# Patient Record
Sex: Female | Born: 1935 | Race: White | Hispanic: No | Marital: Married | Smoking: Never smoker
Health system: Southern US, Community
[De-identification: ages and names within clinical notes are randomized; demographics above are authoritative.]

## PROBLEM LIST (undated history)

## (undated) DIAGNOSIS — G309 Alzheimer's disease, unspecified: Secondary | ICD-10-CM

## (undated) DIAGNOSIS — F0280 Dementia in other diseases classified elsewhere without behavioral disturbance: Secondary | ICD-10-CM

## (undated) HISTORY — PX: APPENDECTOMY: SHX54

---

## 1999-05-04 ENCOUNTER — Ambulatory Visit (HOSPITAL_COMMUNITY): Admission: RE | Admit: 1999-05-04 | Discharge: 1999-05-04 | Payer: Self-pay | Admitting: Neurology

## 2001-07-10 ENCOUNTER — Encounter: Payer: Self-pay | Admitting: Internal Medicine

## 2001-07-10 ENCOUNTER — Encounter: Admission: RE | Admit: 2001-07-10 | Discharge: 2001-07-10 | Payer: Self-pay | Admitting: Internal Medicine

## 2002-03-02 ENCOUNTER — Encounter: Admission: RE | Admit: 2002-03-02 | Discharge: 2002-05-31 | Payer: Self-pay | Admitting: Neurology

## 2002-03-16 ENCOUNTER — Ambulatory Visit: Admission: RE | Admit: 2002-03-16 | Discharge: 2002-03-16 | Payer: Self-pay | Admitting: *Deleted

## 2005-06-08 ENCOUNTER — Emergency Department (HOSPITAL_COMMUNITY): Admission: EM | Admit: 2005-06-08 | Discharge: 2005-06-09 | Payer: Self-pay | Admitting: Emergency Medicine

## 2005-11-13 ENCOUNTER — Inpatient Hospital Stay (HOSPITAL_COMMUNITY): Admission: EM | Admit: 2005-11-13 | Discharge: 2005-11-15 | Payer: Self-pay | Admitting: Emergency Medicine

## 2006-10-04 ENCOUNTER — Inpatient Hospital Stay (HOSPITAL_COMMUNITY): Admission: EM | Admit: 2006-10-04 | Discharge: 2006-10-08 | Payer: Self-pay | Admitting: Emergency Medicine

## 2007-02-18 ENCOUNTER — Encounter: Admission: RE | Admit: 2007-02-18 | Discharge: 2007-02-18 | Payer: Self-pay | Admitting: Neurology

## 2009-06-17 ENCOUNTER — Encounter: Payer: Self-pay | Admitting: Neurology

## 2010-11-24 NOTE — Op Note (Signed)
[name], [name]                   ACCOUNT NO.:  [id number]   MEDICAL RECORD NO.:  [id number]          PATIENT TYPE:  INP   LOCATION:  5705                         FACILITY:  [facility]   PHYSICIAN:  John C. Madilyn Fireman, M.D.    DATE OF BIRTH:  02/24/1926   DATE OF PROCEDURE:  11/15/2005  DATE OF DISCHARGE:  11/15/2005                                 OPERATIVE REPORT   PROCEDURE:  Colonoscopy.   ENDOSCOPIST:  Everardo All. Madilyn Fireman, M.D.   INDICATIONS FOR PROCEDURE:  Intermittent rectal bleeding in a 77 year old  patient with no prior colon screening.   PROCEDURE:  The patient was placed in the left lateral decubitus position  and placed on the pulse monitor with continuous low-flow oxygen delivered by  nasal cannula.  He was sedated with 35 mcg IV fentanyl and 2.5 mg IV Versed.  The Olympus video colonoscope was inserted into the rectum and advanced to  the cecum, confirmed by transillumination of McBurney's point and  visualization of the ileocecal valve and appendiceal orifice.  The prep was  excellent.  The cecum and ascending colon appeared normal with no masses,  polyps, diverticula or other mucosal abnormalities.  There were a few  diverticula seen in the transverse colon and more seen in the descending and  sigmoid colon with no other abnormalities noted.  The rectum appeared  normal.  A retroflexed view of the anus did reveal some small internal  hemorrhoids; there were fairly large external hemorrhoids as well.  The  scope was then withdrawn and the patient returned to the recovery room in  stable condition.  She tolerated the procedure well and there were no  immediate complications.   IMPRESSION:  1.  Diverticulosis.  2.  External hemorrhoids, probably responsible for recent bleeding.   PLAN:  Conservative management of hemorrhoids.           ______________________________  Everardo All Madilyn Fireman, M.D.     JCH/MEDQ  D:  11/15/2005  T:  11/16/2005  Job:  [id number]   cc:   Thora Lance,  M.D.  Fax: [phone]

## 2010-11-24 NOTE — H&P (Signed)
NAMERENIE, STELMACH                   ACCOUNT NO.:  [id number]   MEDICAL RECORD NO.:  [id number]          PATIENT TYPE:  EMS   LOCATION:  ED                           FACILITY:  [facility]   PHYSICIAN:  Barnetta Chapel, MDDATE OF BIRTH:  May 30, 1926   DATE OF ADMISSION:  10/04/2006  DATE OF DISCHARGE:                              HISTORY & PHYSICAL   PRIMARY CARE PHYSICIAN:  Thora Lance, M.D.   CHIEF COMPLAINT:  Nausea and vomiting.   HISTORY OF PRESENTING COMPLAINT:  The patient is a 77 year old female  with a past medical history significant for dementia, hypertension,  history of CVA, dyslipidemia, and basal cell carcinoma.  The patient  also has problems with bleeding hemorrhoids.  Most of the history came  from the patient's husband.  The patient was said to have had episodes  of nausea and vomiting about 2-3 hours prior to presentation.  No  associated abdominal pain.  No fevers or chills.  The patient had an  episode of loose bowels but no watery stools.  There are no sick  contacts.  Patient denied any chest pain or shortness of breath.   According to the patient's husband, the patient's color was said to have  changed during episode of nausea and vomiting, and this made him to call  the ambulance.  On presentation to the ER, the patient's blood sugar was  found to be elevated (282).  The patient was said to have eaten 1 or 2  hours prior to checking the blood sugar.  No history of polyuria,  polydipsia, or polyphagia volunteered.  No weight loss.   PAST MEDICAL HISTORY:  1. Dementia:  The type is uncertain.  2. Hypertension.  3. Peripheral edema.  4. CVA.  5. Depression.  6. Hypercholesterolemia.  7. Decreased urine.  8. Basal cell carcinoma.   PAST SURGICAL HISTORY:  1. Total abdominal hysterectomy.  2. Appendectomy.  3. Basal cell carcinoma excision.   MEDICATIONS PRIOR TO ADMISSION:  1. Aspirin.  2. Exelon.  3. HCTZ.  4. Lipitor.  5. Potassium  chloride.  6. Zoloft (exact dose of the meds are not known for now).   ALLERGIES:  PENICILLIN.   SOCIAL HISTORY:  The patient is married.  She lives with the husband.  She does not smoke cigarettes, does not drink alcohol, or use illicit  drugs.   FAMILY HISTORY:  Noncontributory.   REVIEW OF SYSTEMS:  Essentially as above.   PHYSICAL EXAMINATION:  VITAL SIGNS:  On admission, the temp is 97.3,  blood pressure 112/76, heart rate 91, respiratory rate 16.  GENERAL CONDITION:  The patient looks quite stable.  She is not in any  distress.  HEENT:  No pallor.  No jaundice.  The extraocular muscles are intact.  NECK:  Supple. There is no lymphadenopathy or elevated JVD.  LUNGS:  Clear to auscultation.  CVS:  S1 and S2.  No heart murmur appreciated.  ABDOMEN:  Obese.  Soft and nontender.  Bowel sounds are present.  Organs  are very difficult to assess.  NEURO:  Nonfocal.  The patient moves all extremities.  EXTREMITIES:  No edema.   Labs so far:  The EKG reveals a sinus rhythm with PACs.  The Q-T  interval is said to be prolonged.  I have not seen any old EKG to  compare this with.   White blood cell count is 10.8, hemoglobin 13.3, hematocrit 39.6,  platelets 220, MCV 89.2.  The sodium is 136, potassium 4.1, chloride 99,  CO2 29, BUN 49, creatinine 0.81.  The UA is essentially normal.   IMPRESSION:  1. Nausea and vomiting.  2. Hyperglycemia.  3. Rule out new-onset diabetes mellitus.  4. History of dementia.  5. History of hypertension.  6. History of dyslipidemia.   Will admit the patient to the regular medical floor under Dr. Kirby Funk.  We checked the blood sugar, q.a.c. and nightly but we will not  cover with insulin for now.  We will ask the nurses to call the MD if  the blood sugar is above 280 or less than 95.  We checked Hemoglobin A1C  as well as fasting blood sugar.  Will get the fasting lipid profile.  Will check the cardiac enzymes per protocol.  We will get  lipase and  amylase.  We will also check CMP, mag, and phos.  Further management  will depend on hospital course.      Barnetta Chapel, MD  Electronically Signed    SIO/MEDQ  D:  10/04/2006  T:  10/04/2006  Job:  [id number]   cc:   Thora Lance, M.D.  Fax: [phone]

## 2010-11-24 NOTE — Discharge Summary (Signed)
[name], [name]                   ACCOUNT NO.:  [id number]   MEDICAL RECORD NO.:  [id number]          PATIENT TYPE:  INP   LOCATION:  1618                         FACILITY:  [facility]   PHYSICIAN:  Thora Lance, M.D.  DATE OF BIRTH:  04-22-1926   DATE OF ADMISSION:  10/04/2006  DATE OF DISCHARGE:  10/08/2006                               DISCHARGE SUMMARY   A 77 year old white female who had episodes of nausea and vomiting about  2-3 hours prior to presentation.  No abdominal pain.  There were some  loose bowel movements and watery stools.  She was taken to the ER, where  the blood sugar was found to be 282.   SIGNIFICANT FINDINGS:  Temperature 97.3, blood pressure 112/76, heart  rate 91, respirations 16.  Her exam was unremarkable.  Abdominal exam  soft, nontender, with normal bowel sounds.   White blood cell count 10.8, hemoglobin 13.3, platelets 220.  Sodium  136, potassium 4.1, chloride 99, bicarbonate 29, BUN 41, creatinine 0.8.   HOSPITAL COURSE:  The patient was admitted for a possible  gastroenteritis.  She was treated with IV fluids.  Her nausea, vomiting  and diarrhea all resolved within 24 hours.  She was found to be  diabetic, new diagnosis.  A1c was 9.2.  She was started on metformin  b.i.d.  Her husband will check the blood sugar at home.  The last  hospital day she was up ambulating with minimal assistance for 18 feet  with a rolling walker, was discharged in good condition.   DISCHARGE DIAGNOSES:  1. Gastroenteritis.  2. Diabetes mellitus type 2.  3. Dementia.  4. Hypertension.  5. Peripheral edema.  6. History of cerebrovascular accident.  7. Depression.  8. Hyperlipidemia.   PROCEDURES:  None.   DISCHARGE MEDICATIONS:  1. Metformin 500 mg p.o. b.i.d.  2. Aspirin 81 mg a day.  3. Exelon __________ dose 6 mg a.m., 4 mg p.m.  4. Hydrochlorothiazide 12.5 mg once a day.  5. Lipitor 10 mg a day.  6. Potassium 10 mEq twice a day.  7. Felodipine 5 mg a  day.  8. Sertraline 50 mg one a day.   DISPOSITION:  Discharged to home with home health physical therapy.   DIET:  Low-sodium diabetic diet.   ACTIVITY:  Per physical therapy.   FOLLOW-UP:  2 weeks.           ______________________________  Thora Lance, M.D.     Delorse Limber  D:  10/08/2006  T:  10/08/2006  Job:  [id number]

## 2010-11-24 NOTE — Discharge Summary (Signed)
[name], [name]                   ACCOUNT NO.:  [id number]   MEDICAL RECORD NO.:  [id number]          PATIENT TYPE:  INP   LOCATION:  5705                         FACILITY:  [facility]   PHYSICIAN:  Thora Lance, M.D.  DATE OF BIRTH:  February 23, 1926   DATE OF ADMISSION:  11/13/2005  DATE OF DISCHARGE:  11/15/2005                                 DISCHARGE SUMMARY   REASON FOR ADMISSION:  A 77 year old female who presented with 2 episodes of  hematochezia the day of admission.  There was no abdominal pain.   PHYSICAL EXAMINATION:  VITALS:  Blood pressure is 133/67, heart rate 77,  supine blood pressure 140/51, heart rate 90 standing.  ABDOMEN:  Soft, normal bowel sounds, nontender.   LABORATORIES AT ADMISSION:  CBC:  WBC 11.2, hemoglobin 12.8, platelet count  248, PT 13.4, PTT 33, sodium 141, potassium 3.74, chloride 104, bicarbonate  29, glucose 143, BUN 10, creatinine __________, calcium 9.2, total protein  6,  AST 75, ALT 86, alk phos 85, total bilirubin 8.6, urinalysis normal.   HOSPITAL COURSE:  The patient was admitted for lower GI bleed.  Her  hemoglobin remains stable.  She was given a PPI.  Her aspirin  was held.  She was seen by GI on the second hospital day.  A colonoscopy was done on  May 10 and showed some left colon diverticulosis and external hemorrhoids.  Otherwise normal.  The patient was discharged home in good condition.   DISCHARGE DIAGNOSES:  1. Lower GI bleed.  2. Dementia.  3. Hypertension.  4. History of depression.   PROCEDURE:  Colonoscopy.   DISCHARGE MEDICATIONS:  1. Lipitor 10 mg daily.  2. Meclizine 12.5 mg, 2 tablets b.i.d.  3. Potassium chloride 10 mEq b.i.d.  4. Sertraline 50 mg daily.  5. Exelon 6 mg in AM, 4.5 mg PM.  6. Premarin 1.25 mg.  7. __________  5 mg a day.  8. Felodipine 5 mg a day.   DISPOSITION DISCHARGE HOME:  Activity as tolerated.  Diet:  Low sodium.  Follow up 2 weeks Dr. Valentina Lucks.           ______________________________  Thora Lance, M.D.     JJG/MEDQ  D:  02/21/2006  T:  02/21/2006  Job:  [id number]

## 2010-11-24 NOTE — H&P (Signed)
[name], [name]                   ACCOUNT NO.:  [id number]   MEDICAL RECORD NO.:  [id number]          PATIENT TYPE:  INP   LOCATION:  1826                         FACILITY:  [facility]   PHYSICIAN:  Marcene Duos, M.D.DATE OF BIRTH:  [birth year]   DATE OF ADMISSION:  11/13/2005  DATE OF DISCHARGE:                                HISTORY & PHYSICAL   HISTORY OF PRESENT ILLNESS:  [name] is a 77 year old female patient of Dr.  Kirby [name] with a history of dementia and hemorrhoids who presents with a  history of 2 stools today described consisting of mainly clotted dark blood  per her husband.  This apparently was described to him by a caretaker who  watches her during the day.  The last one was around 3 p.m.  They both deny  any abdominal pain, nausea, vomiting or dizziness.  Her last regular bowel  movement, he believes, was possibly last evening and was soft.  She has not  had any recent weight loss, melena or hematochezia.  When her hemorrhoids  have acted up in the past, it has been more of a pinkening to the toilet  water rather than what they saw today.  In the emergency room, she was not  found to be orthostatic.  Her coags were normal.  Her hemoglobin was 12.8  and her stool was heme negative, as performed by the ED physician, and she  has no obvious actively bleeding hemorrhoids.   PAST MEDICAL HISTORY:  1.  Dementia, uncertain what type.  2.  Hypertension.  3.  Peripheral edema.  4.  Remote history of stroke.  5.  Depression.  6.  Hypercholesterolemia.  7.  Decreased hearing.  8.  Basal cell carcinoma.   PAST SURGICAL HISTORY:  1.  TAH and BSO performed in different stages, all for benign reasons.  2.  Appendectomy.  3.  Basal cell carcinoma excision.   MEDICATIONS:  1.  Aspirin 81 mg daily.  2.  Lipitor 10 mg daily.  3.  Meclizine 12.5 mg two tablets b.i.d.  4.  KCl 10 mEq b.i.d.  5.  Sertraline 50 mg daily.  6.  Exelon 6 mg q.a.m., 4.5 mg q.p.m.  7.   Premarin 1.25 mg daily.  8.  Hydrochlorothiazide 25 mg daily.  9.  Felodipine 5 mg daily.  10. Multivitamins.   ALLERGIES:  PENICILLIN - rash.   SOCIAL HISTORY:  Tobacco and alcohol none.  History of Manufacturing engineer  and housewife.  Married for 57 years on January 20, 2006.   FAMILY HISTORY:  Father died at age 79 of prostate cancer.  Mother died in  her early 6s after being bedridden by a stroke.  Four siblings - a brother,  age 49, with psychiatric disease.  A sister died, she believes, of  pneumonia, and also had psychiatric disease of unknown type.  Brother died  of lung cancer; he was a smoker.  Sister died of metastatic breast cancer.  They have 3 living children, 1 was stillborn.  They have a son who is 18  with  emphysema and a smoker.   REVIEW OF SYSTEMS:  No history of colonoscopy.  No family history of colon  cancer.  No history of diverticulosis.   PHYSICAL EXAMINATION:  VITAL SIGNS:  Temperature 98.2, respirations 18, with  sort of glottic breathing and maybe some mild abdominal breathing, but she  is lying somewhat awkwardly on the cot, 99% on room air, but currently on 2  liters of oxygen.  Blood pressure lying 133/67 with a pulse of 77, blood  pressure standing 140/61 with a pulse of 90.  GENERAL:  The patient is alert, laughing, in no acute distress.  HEENT:  Pupils equal, round and reactive to light.  Extraocular movements  intact.  Throat without injection.  NECK:  Supple without adenopathy.  No thyromegaly.  CHEST:  Clear.  Again, the glottic breathing is sort of abdominal muscle  use, as well.  ABDOMEN:  Obese.  Protuberant and soft.  Bowel sounds are present  throughout.  Nontender.  No hepatosplenomegaly or mass appreciated.  CARDIOVASCULAR:  Regular rate and rhythm without murmur or rub.  No carotid  bruits.  Carotid, radial, femoral, PT pulses normodynamic and equal.  EXTREMITIES:  Trace edema.   ASSESSMENT AND PLAN:  1.  Probable lower  gastrointestinal bleed, though her hemoglobin is actually      quite good at this point.  She did have abdominal x-rays.  I have not      had a chance to review.  Will check those, as well.  Will place her on a      PPI, despite this most likely being a lower GI bleed.  Attempting to      consult GI to see if they will evaluate her in the morning.  If her      hemoglobin, however, is stable, she may be able to go home and follow up      with GI as an outpatient.  Will hold her aspirin for now.  NPO after      midnight.  Clear liquids until then.  IV at [facility].  2.  Hypertension.  Continue felodipine only.  For now, will hold      hydrochlorothiazide.  3.  Dementia.  Will continue her Exelon.  4.  Depression.  Continue her sertraline, as well.  5.  Elevated liver enzymes.  The patient is on Lipitor and we are holding      that overnight.  Will recheck liver profile in the morning, but suspect      this will have to be followed up as an outpatient.           ______________________________  Marcene Duos, M.D.     EMM/MEDQ  D:  11/13/2005  T:  11/13/2005  Job:  [id number]

## 2011-06-26 ENCOUNTER — Other Ambulatory Visit (INDEPENDENT_AMBULATORY_CARE_PROVIDER_SITE_OTHER): Payer: Self-pay | Admitting: Surgery

## 2012-05-13 ENCOUNTER — Other Ambulatory Visit (INDEPENDENT_AMBULATORY_CARE_PROVIDER_SITE_OTHER): Payer: Self-pay | Admitting: Surgery

## 2013-12-28 ENCOUNTER — Emergency Department (HOSPITAL_COMMUNITY): Payer: Medicare Other

## 2013-12-28 ENCOUNTER — Emergency Department (HOSPITAL_COMMUNITY)
Admission: EM | Admit: 2013-12-28 | Discharge: 2013-12-28 | Disposition: A | Discharge status: Home / Self Care | Payer: Medicare Other | Attending: Emergency Medicine | Admitting: Emergency Medicine

## 2013-12-28 ENCOUNTER — Encounter (HOSPITAL_COMMUNITY): Payer: Self-pay | Admitting: Emergency Medicine

## 2013-12-28 DIAGNOSIS — N390 Urinary tract infection, site not specified: Secondary | ICD-10-CM | POA: Insufficient documentation

## 2013-12-28 DIAGNOSIS — R55 Syncope and collapse: Secondary | ICD-10-CM | POA: Insufficient documentation

## 2013-12-28 DIAGNOSIS — R079 Chest pain, unspecified: Secondary | ICD-10-CM | POA: Insufficient documentation

## 2013-12-28 DIAGNOSIS — R5382 Chronic fatigue, unspecified: Secondary | ICD-10-CM | POA: Insufficient documentation

## 2013-12-28 DIAGNOSIS — R404 Transient alteration of awareness: Secondary | ICD-10-CM | POA: Insufficient documentation

## 2013-12-28 DIAGNOSIS — F0281 Dementia in other diseases classified elsewhere with behavioral disturbance: Secondary | ICD-10-CM | POA: Insufficient documentation

## 2013-12-28 DIAGNOSIS — Z880 Allergy status to penicillin: Secondary | ICD-10-CM | POA: Insufficient documentation

## 2013-12-28 DIAGNOSIS — F02818 Dementia in other diseases classified elsewhere, unspecified severity, with other behavioral disturbance: Secondary | ICD-10-CM | POA: Insufficient documentation

## 2013-12-28 DIAGNOSIS — G309 Alzheimer's disease, unspecified: Secondary | ICD-10-CM | POA: Insufficient documentation

## 2013-12-28 DIAGNOSIS — G9332 Myalgic encephalomyelitis/chronic fatigue syndrome: Secondary | ICD-10-CM | POA: Insufficient documentation

## 2013-12-28 DIAGNOSIS — F0280 Dementia in other diseases classified elsewhere without behavioral disturbance: Secondary | ICD-10-CM | POA: Insufficient documentation

## 2013-12-28 HISTORY — DX: Dementia in other diseases classified elsewhere, unspecified severity, without behavioral disturbance, psychotic disturbance, mood disturbance, and anxiety: F02.80

## 2013-12-28 HISTORY — DX: Alzheimer's disease, unspecified: G30.9

## 2013-12-28 LAB — URINALYSIS, ROUTINE W REFLEX MICROSCOPIC
Bilirubin Urine: NEGATIVE
Glucose, UA: NEGATIVE mg/dL
Ketones, ur: NEGATIVE mg/dL
Nitrite: NEGATIVE
Protein, ur: NEGATIVE mg/dL
Specific Gravity, Urine: 1.024 (ref 1.005–1.030)
Urobilinogen, UA: 0.2 mg/dL (ref 0.0–1.0)
pH: 6.5 (ref 5.0–8.0)

## 2013-12-28 LAB — COMPREHENSIVE METABOLIC PANEL
ALT: 20 U/L (ref 0–35)
AST: 21 U/L (ref 0–37)
Albumin: 3.7 g/dL (ref 3.5–5.2)
Alkaline Phosphatase: 85 U/L (ref 39–117)
BUN: 14 mg/dL (ref 6–23)
CO2: 30 mEq/L (ref 19–32)
Calcium: 9.9 mg/dL (ref 8.4–10.5)
Chloride: 104 mEq/L (ref 96–112)
Creatinine, Ser: 0.57 mg/dL (ref 0.50–1.10)
GFR calc Af Amer: >90 mL/min (ref >90)
GFR calc non Af Amer: 82 mL/min — ABNORMAL LOW (ref >90)
Glucose, Bld: 158 mg/dL — ABNORMAL HIGH (ref 70–99)
Potassium: 4.8 mEq/L (ref 3.7–5.3)
Sodium: 144 mEq/L (ref 137–147)
Total Bilirubin: 0.3 mg/dL (ref 0.3–1.2)
Total Protein: 7.1 g/dL (ref 6.0–8.3)

## 2013-12-28 LAB — CBG MONITORING, ED: Glucose-Capillary: 111 mg/dL — ABNORMAL HIGH (ref 70–99)

## 2013-12-28 LAB — CBC
HEMATOCRIT: 42.2 % (ref 36.0–46.0)
Hemoglobin: 13.3 g/dL (ref 12.0–15.0)
MCH: 29.3 pg (ref 26.0–34.0)
MCHC: 31.5 g/dL (ref 30.0–36.0)
MCV: 93 fL (ref 78.0–100.0)
Platelets: 242 10*3/uL (ref 150–400)
RBC: 4.54 MIL/uL (ref 3.87–5.11)
RDW: 13.3 % (ref 11.5–15.5)
WBC: 10.5 10*3/uL (ref 4.0–10.5)

## 2013-12-28 LAB — I-STAT TROPONIN, ED: TROPONIN I, POC: 0 ng/mL (ref 0.00–0.08)

## 2013-12-28 LAB — URINE MICROSCOPIC-ADD ON

## 2013-12-28 MED ORDER — CEPHALEXIN 500 MG PO CAPS: 500.0000 mg | ORAL_CAPSULE | Freq: Four times a day (QID) | ORAL | Status: AC

## 2013-12-28 MED ORDER — CEPHALEXIN 250 MG PO CAPS
500.0000 mg | ORAL_CAPSULE | Freq: Once | ORAL | Status: AC
  Administered 2013-12-28: 500 mg via ORAL
  Filled 2013-12-28: qty 2

## 2013-12-28 NOTE — ED Provider Notes (Signed)
CSN: [id number]     Arrival date & time 12/28/13  1338 History   First MD Initiated Contact with Patient 12/28/13 1414     Chief Complaint  Patient presents with  . Altered Mental Status  . Fatigue  . Loss of Consciousness     (Consider location/radiation/quality/duration/timing/severity/associated sxs/prior Treatment) HPI Comments: Patient is an 80 year old female with a past medical history of alzheimer's dementia who presents after a syncopal episode that occurred prior to arrival. Patient is brought to the ED by her caretaker who also provides the history. Her caretaker reports she was giving the patient a bath this morning and the patient had a syncopal episode. She was unconscious for a brief period of time. No head trauma. Patient has "been sleeping all day and not acting herself." No other symptoms. No aggravating/alleviating factors.   Patient is a 80 y.o. female presenting with altered mental status and syncope.  Altered Mental Status Associated symptoms: no abdominal pain, no fever, no nausea, no palpitations, no vomiting and no weakness   Loss of Consciousness Associated symptoms: no chest pain, no dizziness, no fever, no nausea, no palpitations, no shortness of breath, no vomiting and no weakness     Past Medical History  Diagnosis Date  . Alzheimer's dementia    History reviewed. No pertinent past surgical history. History reviewed. No pertinent family history. History  Substance Use Topics  . Smoking status: Never Smoker   . Smokeless tobacco: Not on file  . Alcohol Use: No   OB History   Grav Para Term Preterm Abortions TAB SAB Ect Mult Living                 Review of Systems  Constitutional: Negative for fever, chills and fatigue.  HENT: Negative for trouble swallowing.   Eyes: Negative for visual disturbance.  Respiratory: Negative for shortness of breath.   Cardiovascular: Positive for syncope. Negative for chest pain and palpitations.   Gastrointestinal: Negative for nausea, vomiting, abdominal pain and diarrhea.  Genitourinary: Negative for dysuria and difficulty urinating.  Musculoskeletal: Negative for arthralgias and neck pain.  Skin: Negative for color change.  Neurological: Positive for syncope. Negative for dizziness and weakness.  Psychiatric/Behavioral: Negative for dysphoric mood.      Allergies  Penicillins  Home Medications   Prior to Admission medications   Not on File   BP 113/95  Pulse 83  Temp(Src) 97.8 F (36.6 C) (Oral)  Resp 24  SpO2 96% Physical Exam  Nursing note and vitals reviewed. Constitutional: She appears well-developed and well-nourished. No distress.  HENT:  Head: Normocephalic and atraumatic.  Mouth/Throat: Oropharynx is clear and moist. No oropharyngeal exudate.  Eyes: Conjunctivae and EOM are normal. Pupils are equal, round, and reactive to light.  Neck: Normal range of motion.  Cardiovascular: Normal rate and regular rhythm.  Exam reveals no gallop and no friction rub.   No murmur heard. Pulmonary/Chest: Effort normal and breath sounds normal. She has no wheezes. She has no rales. She exhibits no tenderness.  Abdominal: Soft. She exhibits no distension. There is no tenderness. There is no rebound and no guarding.  Musculoskeletal: Normal range of motion.  Neurological: She is alert. No cranial nerve deficit. Coordination normal.  Extremity strength and sensation equal and intact bilaterally. Patient oriented to persona and place. Speech is goal-oriented. Moves limbs without ataxia.   Skin: Skin is warm and dry.  Psychiatric: She has a normal mood and affect. Her behavior is normal.  ED Course  Procedures (including critical care time) Labs Review Labs Reviewed  COMPREHENSIVE METABOLIC PANEL - Abnormal; Notable for the following:    Glucose, Bld 158 (*)    GFR calc non Af Amer 82 (*)    All other components within normal limits  CBG MONITORING, ED - Abnormal;  Notable for the following:    Glucose-Capillary 111 (*)    All other components within normal limits  CBC  URINALYSIS, ROUTINE W REFLEX MICROSCOPIC  I-STAT TROPOININ, ED    Imaging Review Dg Chest 2 View  12/28/2013   CLINICAL DATA:  Syncope.  EXAM: CHEST  2 VIEW  COMPARISON:  None.  FINDINGS: Hyperinflation without edema or focal airspace consolidation. The cardiopericardial silhouette is within normal limits for size. Focal opacity noted at the right cardiophrenic border. Imaged bony structures of the thorax are intact.  IMPRESSION: Focal density at the right cardiophrenic border. Lung lesion is not excluded. CT chest without contrast recommended to further evaluate.  Hyperexpansion without edema.  These results will be called to the ordering clinician or representative by the Radiologist Assistant, and communication documented in the PACS or zVision Dashboard.   Electronically Signed   By: Kennith CenterEric  Mansell M.D.   On: 12/28/2013 15:20   Ct Head Wo Contrast  12/28/2013   CLINICAL DATA:  Altered mental status.  EXAM: CT HEAD WITHOUT CONTRAST  TECHNIQUE: Contiguous axial images were obtained from the base of the skull through the vertex without intravenous contrast.  COMPARISON:  MRI brain 06/17/2009.  FINDINGS: No mass. No hydrocephalus. No hemorrhage. White matter changes noted consistent with chronic ischemia. Old lacunar infarcts. No acute bony abnormality.  IMPRESSION: Diffuse cerebral and cerebellar atrophy. Chronic white matter ischemic change and old lacunar infarcts. Similar findings noted on prior MRI brain 06/17/2009.   Electronically Signed   By: Maisie Fushomas  Register   On: 12/28/2013 16:44   Ct Chest Wo Contrast  12/28/2013   CLINICAL DATA:  Fatigue. Possible pulmonary nodule noted on recent chest radiograph.  EXAM: CT CHEST WITHOUT CONTRAST  TECHNIQUE: Multidetector CT imaging of the chest was performed following the standard protocol without IV contrast.  COMPARISON:  No priors.  FINDINGS:  Mediastinum: Heart size is normal. There is no significant pericardial fluid, thickening or pericardial calcification. There is atherosclerosis of the thoracic aorta, the great vessels of the mediastinum and the coronary arteries, including calcified atherosclerotic plaque in the left anterior descending coronary arteries. Calcifications of the mitral annulus. No pathologically enlarged mediastinal or hilar lymph nodes. Please note that accurate exclusion of hilar adenopathy is limited on noncontrast CT scans. Esophagus is unremarkable in appearance. Prominent epicardial fat pad adjacent to the right atrium likely accounts for the perceived nodular opacity on the recent chest radiograph.  Lungs/Pleura: 6 mm right lower lobe nodule (image 34 of series 202). Other smaller nodules are seen in the lungs bilaterally, including a 5 mm subpleural nodule in the right lower lobe (image 45 of series 202, and a 2 mm nodule in in the left lower lobe (image 24 of series 202). No larger more suspicious appearing pulmonary nodules or masses are noted. No acute consolidative airspace disease. No pleural effusions.  Upper Abdomen: 2 cm low-attenuation lesion in the posterior aspect of the upper pole of the left kidney is incompletely characterized, but statistically likely a cyst. Nonobstructive calculi in the left renal collecting system, largest of which measures 11 mm in the interpolar region. Atherosclerosis.  Musculoskeletal: There are no aggressive appearing lytic or  blastic lesions noted in the visualized portions of the skeleton.  IMPRESSION: 1. The large nodular opacity noted on the recent chest radiograph corresponds to a prominent epicardial fat pad. This is a benign finding. 2. However, there are multiple small pulmonary nodules scattered throughout the lungs bilaterally which are nonspecific, largest of which measures 6 mm in the right lower lobe. If the patient is at high risk for bronchogenic carcinoma, follow-up  chest CT at 6-12 months is recommended. If the patient is at low risk for bronchogenic carcinoma, follow-up chest CT at 12 months is recommended. This recommendation follows the consensus statement: Guidelines for Management of Small Pulmonary Nodules Detected on CT Scans: A Statement from the Fleischner Society as published in Radiology 2005;237:395-400. 3. 11 mm nonobstructive calculus in the interpolar collecting system of the left kidney. 4. Atherosclerosis, including left anterior descending coronary artery disease.   Electronically Signed   By: Trudie Reedaniel  Entrikin M.D.   On: 12/28/2013 16:56     EKG Interpretation None      MDM   Final diagnoses:  UTI (lower urinary tract infection)  Chronic fatigue    2:24 PM Labs, urinalysis and chest xray pending. Vitals stable and patient afebrile. CT head pending.   Patient's labs unremarkable for acute changes. Patient signed out to Dr. Clayborne DanaMesner pending CT head, chest and urinalysis. Vitals stable and patient afebrile. If remaining findings are unremarkable, patient will be discharged home.     Emilia BeckKaitlyn Szekalski, [place] JerseyPA-C 12/29/13 [phone]

## 2013-12-28 NOTE — ED Notes (Signed)
Pt out of the department at this time

## 2013-12-28 NOTE — ED Provider Notes (Signed)
9:35 PM Assumed care from [facility]zek[facility], [place], please see their note for full history, physical and decision making until this point. In brief this is a 80 y.o. year old female who presented to the ED tonight with Altered Mental Status, Fatigue and Loss of Consciousness     Appears well. Awaiting head CT read and chest ct read (nodule seen on CXR) and urine. If all is well then can be d/c.  Imaging negative, labs well. Patient at baseline. Likely UTI. Culture already sent, will treat and fu w/ pcp.   Discharge instructions, including strict return precautions for new or worsening symptoms, given. Patient and/or family verbalized understanding and agreement with the plan as described.   Labs, studies and imaging reviewed by myself and considered in medical decision making if ordered. Imaging interpreted by radiology. Pt was discussed with my attending, Dr. Jeraldine LootsLockwood.  Labs Reviewed  COMPREHENSIVE METABOLIC PANEL - Abnormal; Notable for the following:    Glucose, Bld 158 (*)    GFR calc non Af Amer 82 (*)    All other components within normal limits  URINALYSIS, ROUTINE W REFLEX MICROSCOPIC - Abnormal; Notable for the following:    APPearance CLOUDY (*)    Hgb urine dipstick MODERATE (*)    Leukocytes, UA LARGE (*)    All other components within normal limits  URINE MICROSCOPIC-ADD ON - Abnormal; Notable for the following:    Bacteria, UA FEW (*)    All other components within normal limits  CBG MONITORING, ED - Abnormal; Notable for the following:    Glucose-Capillary 111 (*)    All other components within normal limits  URINE CULTURE  CBC  I-STAT TROPOININ, ED     Discharge Medication List as of 12/28/2013  5:05 PM    START taking these medications   Details  cephALEXin (KEFLEX) 500 MG capsule Take 1 capsule (500 mg total) by mouth 4 (four) times daily., Starting 12/28/2013, Last dose on Mon 01/04/14, Print        Follow-up Information   Follow up with Lillia MountainGRIFFIN,JOHN JOSEPH, MD  In 2 days. (to ensure improvement)    Specialty:  Internal Medicine   Contact information:   [address] [place] [address] [phone]        Marily MemosJason Mesner, MD 12/29/13 2135

## 2013-12-28 NOTE — ED Notes (Signed)
Per caregiver sts she came by the house this am and was giving the patient a bath and had syncopal episode. sts she has been sleeping all day. sts not acting  herself.

## 2013-12-29 NOTE — ED Provider Notes (Signed)
Medical screening examination/treatment/procedure(s) were conducted as a shared visit with non-physician practitioner(s) and myself.  I personally evaluated the patient during the encounter.   EKG Interpretation   Date/Time:  Monday December 28 2013 13:55:48 EDT Ventricular Rate:  78 PR Interval:  192 QRS Duration: 66 QT Interval:  396 QTC Calculation: 451 R Axis:   81 Text Interpretation:  Normal sinus rhythm Artifact No significant change  since last tracing Confirmed by KNAPP  MD-J, JON ([id number]) on 12/29/2013  10:57:25 AM      Pt presented primarily with fatigue and not acting like herself.    Normal exam in the ED.  Noted to have a uti on UA.  Dc home with rx for keflex.     Linwood DibblesJon Knapp, MD 12/29/13 1059

## 2013-12-30 LAB — URINE CULTURE: Colony Count: >=100000

## 2013-12-31 ENCOUNTER — Telehealth (HOSPITAL_COMMUNITY): Payer: Self-pay

## 2013-12-31 NOTE — ED Notes (Signed)
Post ED Visit - Positive Culture Follow-up  Culture report reviewed by antimicrobial stewardship pharmacist: []  Wes Dulaney, Pharm.D., BCPS []  Celedonio MiyamotoJeremy Frens, Pharm.D., BCPS []  Georgina PillionElizabeth Martin, [address]Ph[address] BCPS [x]  Seeley LakeMinh Pham, [address]Ph[address]D., BCPS, AAHIVP []  Estella HuskMichelle Turner, Pharm.D., BCPS, AAHIVP []  Harvie JuniorNathan Cope, Pharm.D.  Positive urine culture Treated with cephalexin, organism sensitive to the same and no further patient follow-up is required at this time.  Ashley JacobsFesterman, Toni C 12/31/2013, 12:20 PM

## 2015-07-02 ENCOUNTER — Encounter (HOSPITAL_COMMUNITY): Payer: Self-pay | Admitting: Emergency Medicine

## 2015-07-02 ENCOUNTER — Emergency Department (HOSPITAL_COMMUNITY)
Admission: EM | Admit: 2015-07-02 | Discharge: 2015-07-02 | Disposition: A | Discharge status: Home / Self Care | Payer: Medicare Other | Attending: Emergency Medicine | Admitting: Emergency Medicine

## 2015-07-02 DIAGNOSIS — R404 Transient alteration of awareness: Secondary | ICD-10-CM | POA: Insufficient documentation

## 2015-07-02 DIAGNOSIS — R4182 Altered mental status, unspecified: Secondary | ICD-10-CM | POA: Diagnosis present

## 2015-07-02 DIAGNOSIS — F0390 Unspecified dementia without behavioral disturbance: Secondary | ICD-10-CM | POA: Insufficient documentation

## 2015-07-02 DIAGNOSIS — Z79899 Other long term (current) drug therapy: Secondary | ICD-10-CM | POA: Diagnosis not present

## 2015-07-02 DIAGNOSIS — Z7982 Long term (current) use of aspirin: Secondary | ICD-10-CM | POA: Insufficient documentation

## 2015-07-02 DIAGNOSIS — Z880 Allergy status to penicillin: Secondary | ICD-10-CM | POA: Insufficient documentation

## 2015-07-02 DIAGNOSIS — R531 Weakness: Secondary | ICD-10-CM | POA: Insufficient documentation

## 2015-07-02 LAB — CBC WITH DIFFERENTIAL/PLATELET
BASOS ABS: 0 10*3/uL (ref 0.0–0.1)
Basophils Relative: 0 %
EOS PCT: 4 %
Eosinophils Absolute: 0.3 10*3/uL (ref 0.0–0.7)
HCT: 42.9 % (ref 36.0–46.0)
Hemoglobin: 13.3 g/dL (ref 12.0–15.0)
Lymphocytes Relative: 34 %
Lymphs Abs: 3 10*3/uL (ref 0.7–4.0)
MCH: 29.9 pg (ref 26.0–34.0)
MCHC: 31 g/dL (ref 30.0–36.0)
MCV: 96.4 fL (ref 78.0–100.0)
Monocytes Absolute: 0.8 10*3/uL (ref 0.1–1.0)
Monocytes Relative: 9 %
Neutro Abs: 4.7 10*3/uL (ref 1.7–7.7)
Neutrophils Relative %: 53 %
Platelets: 241 10*3/uL (ref 150–400)
RBC: 4.45 MIL/uL (ref 3.87–5.11)
RDW: 13.3 % (ref 11.5–15.5)
WBC: 8.7 10*3/uL (ref 4.0–10.5)

## 2015-07-02 LAB — BASIC METABOLIC PANEL
Anion gap: 8 (ref 5–15)
BUN: 17 mg/dL (ref 6–20)
CALCIUM: 9.3 mg/dL (ref 8.9–10.3)
CO2: 29 mmol/L (ref 22–32)
CREATININE: 0.61 mg/dL (ref 0.44–1.00)
Chloride: 106 mmol/L (ref 101–111)
GFR calc Af Amer: >60 mL/min (ref >60)
Glucose, Bld: 126 mg/dL — ABNORMAL HIGH (ref 65–99)
Potassium: 4.6 mmol/L (ref 3.5–5.1)
Sodium: 143 mmol/L (ref 135–145)

## 2015-07-02 NOTE — ED Notes (Signed)
[phone]  Romeo RabonJustin Amrhein, Lucila MaineGrandson

## 2015-07-02 NOTE — Discharge Instructions (Signed)
Confusion Confusion is the inability to think with your usual speed or clarity. Confusion may come on quickly or slowly over time. How quickly the confusion comes on depends on the cause. Confusion can be due to any number of causes. CAUSES   Concussion, head injury, or head trauma.  Seizures.  Stroke.  Fever.  Brain tumor.  Age related decreased brain function (dementia).  Heightened emotional states like rage or terror.  Mental illness in which the person loses the ability to determine what is real and what is not (hallucinations).  Infections such as a urinary tract infection (UTI).  Toxic effects from alcohol, drugs, or prescription medicines.  Dehydration and an imbalance of salts in the body (electrolytes).  Lack of sleep.  Low blood sugar (diabetes).  Low levels of oxygen from conditions such as chronic lung disorders.  Drug interactions or other medicine side effects.  Nutritional deficiencies, especially niacin, thiamine, vitamin C, or vitamin B.  Sudden drop in body temperature (hypothermia).  Change in routine, such as when traveling or hospitalized. SIGNS AND SYMPTOMS  People often describe their thinking as cloudy or unclear when they are confused. Confusion can also include feeling disoriented. That means you are unaware of where or who you are. You may also not know what the date or time is. If confused, you may also have difficulty paying attention, remembering, and making decisions. Some people also act aggressively when they are confused.  DIAGNOSIS  The medical evaluation of confusion may include:  Blood and urine tests.  X-rays.  Brain and nervous system tests.  Analyzing your brain waves (electroencephalogram or EEG).  Magnetic resonance imaging (MRI) of your head.  Computed tomography (CT) scan of your head.  Mental status tests in which your health care provider may ask many questions. Some of these questions may seem silly or strange,  but they are a very important test to help diagnose and treat confusion. TREATMENT  An admission to the hospital may not be needed, but a person with confusion should not be left alone. Stay with a family member or friend until the confusion clears. Avoid alcohol, pain relievers, or sedative drugs until you have fully recovered. Do not drive until directed by your health care provider. HOME CARE INSTRUCTIONS  What family and friends can do:  To find out if someone is confused, ask the person to state his or her name, age, and the date. If the person is unsure or answers incorrectly, he or she is confused.  Always introduce yourself, no matter how well the person knows you.  Often remind the person of his or her location.  Place a calendar and clock near the confused person.  Help the person with his or her medicines. You may want to use a pill box, an alarm as a reminder, or give the person each dose as prescribed.  Talk about current events and plans for the day.  Try to keep the environment calm, quiet, and peaceful.  Make sure the person keeps follow-up visits with his or her health care provider. PREVENTION  Ways to prevent confusion:  Avoid alcohol.  Eat a balanced diet.  Get enough sleep.  Take medicine only as directed by your health care provider.  Do not become isolated. Spend time with other people and make plans for your days.  Keep careful watch on your blood sugar levels if you are diabetic. SEEK IMMEDIATE MEDICAL CARE IF:   You develop severe headaches, repeated vomiting, seizures, blackouts, or   slurred speech.  There is increasing confusion, weakness, numbness, restlessness, or personality changes.  You develop a loss of balance, have marked dizziness, feel uncoordinated, or fall.  You have delusions, hallucinations, or develop severe anxiety.  Your family members think you need to be rechecked.   This information is not intended to replace advice given  to you by your health care provider. Make sure you discuss any questions you have with your health care provider.   Document Released: 08/02/2004 Document Revised: 07/16/2014 Document Reviewed: 07/31/2013 Elsevier Interactive Patient Education 2016 Elsevier Inc.  

## 2015-07-02 NOTE — ED Provider Notes (Signed)
CSN: [id number]     Arrival date & time 07/02/15  1317 History   First MD Initiated Contact with Patient 07/02/15 1322     Chief Complaint  Patient presents with  . Altered Mental Status     (Consider location/radiation/quality/duration/timing/severity/associated sxs/prior Treatment) Patient is a 81 y.o. female presenting with weakness. The history is provided by the spouse and a caregiver.  Weakness This is a new problem. The current episode started 1 to 2 hours ago (Was difficult to wake up and slept through her morning bathing routine). The problem occurs constantly. The problem has not changed since onset.Pertinent negatives include no chest pain, no abdominal pain and no shortness of breath. Nothing aggravates the symptoms. Relieved by: Stimulation from EMS.    Past Medical History  Diagnosis Date  . Alzheimer's dementia    Past Surgical History  Procedure Laterality Date  . Appendectomy     History reviewed. No pertinent family history. Social History  Substance Use Topics  . Smoking status: Never Smoker   . Smokeless tobacco: None  . Alcohol Use: No   OB History    No data available     Review of Systems  Respiratory: Negative for shortness of breath.   Cardiovascular: Negative for chest pain.  Gastrointestinal: Negative for abdominal pain.  Neurological: Positive for weakness.  All other systems reviewed and are negative.     Allergies  Penicillins  Home Medications   Prior to Admission medications   Medication Sig Start Date End Date Taking? Authorizing Provider  aspirin 81 MG tablet Take 81 mg by mouth daily.   Yes Historical Provider, MD  atorvastatin (LIPITOR) 10 MG tablet Take 10 mg by mouth daily.   Yes Historical Provider, MD  guaifenesin (ROBITUSSIN) 100 MG/5ML syrup Take 200 mg by mouth 3 (three) times daily as needed for cough.   Yes Historical Provider, MD  hydrochlorothiazide (MICROZIDE) 12.5 MG capsule Take 12.5 mg by mouth daily.   Yes  Historical Provider, MD  metFORMIN (GLUCOPHAGE) 500 MG tablet Take 500 mg by mouth daily.   Yes Historical Provider, MD  potassium chloride (K-DUR,KLOR-CON) 10 MEQ tablet Take 10 mEq by mouth 2 (two) times daily.   Yes Historical Provider, MD  rivastigmine (EXELON) 4.5 MG capsule Take 4.5 mg by mouth daily. 04/24/15  Yes Historical Provider, MD  sertraline (ZOLOFT) 50 MG tablet Take 50 mg by mouth daily.   Yes Historical Provider, MD  sodium fluoride (PREVIDENT 5000 DRY MOUTH) 1.1 % GEL dental gel Place 1 application onto teeth at bedtime.   Yes Historical Provider, MD   BP 150/65 mmHg  Pulse 70  Temp(Src) 98 F (36.7 C) (Oral)  Resp 18  Ht 5\' 2"  (1.575 m)  Wt 134 lb (60.782 kg)  BMI 24.50 kg/m2  SpO2 94% Physical Exam  Constitutional: She appears well-developed and well-nourished. No distress.  HENT:  Head: Normocephalic.  Eyes: Conjunctivae are normal.  Neck: Neck supple. No tracheal deviation present.  Cardiovascular: Normal rate, regular rhythm and normal heart sounds.   Pulmonary/Chest: Effort normal and breath sounds normal. No respiratory distress.  Abdominal: Soft. She exhibits no distension. There is no tenderness.  Neurological: She is alert. She is disoriented (At baseline).  Skin: Skin is warm and dry.  Psychiatric: She has a normal mood and affect.    ED Course  Procedures (including critical care time) Labs Review Labs Reviewed  BASIC METABOLIC PANEL - Abnormal; Notable for the following:    Glucose, Bld 126 (*)  All other components within normal limits  CBC WITH DIFFERENTIAL/PLATELET    Imaging Review No results found. I have personally reviewed and evaluated these images and lab results as part of my medical decision-making.   EKG Interpretation   Date/Time:  Saturday July 02 2015 13:33:46 EST Ventricular Rate:  73 PR Interval:  192 QRS Duration: 89 QT Interval:  420 QTC Calculation: 463 R Axis:   72 Text Interpretation:  Sinus tachycardia  Multiform ventricular premature  complexes Low voltage, precordial leads No significant change since last  tracing Confirmed by Camaria Gerald MD, Reuel Boom ([id number]) on 07/02/2015 1:40:35 PM      MDM   Final diagnoses:  Transient alteration of awareness    81 y.o. female presents with lethargy and difficulty waking up in the morning. It is common for her to have difficulty waking, she had a new nursing aid at home who is unfamiliar with her baseline state of dementia and difficulty waking. Her husband has a poor memory according to her grandson who is available by phone to discuss the case. The patient is in no acute distress and is completely returned to her baseline by the time she arrived in the emergency department. Screening labs are unremarkable. Discussed return to home with family and caregiver and all parties agree to plan to follow up routinely with primary care as needed or return to the emergency Department with any worsening of symptoms.    Lyndal Pulley, MD 07/03/15 [phone]

## 2015-07-02 NOTE — ED Notes (Signed)
Spoke with MD Clydene PughKnott.

## 2015-07-02 NOTE — ED Notes (Signed)
Pt here from home- family called EMS because pt "wasn't waking up." Upon EMS arrival, pt was responsive to loud verbal and painful stimuli. Pt lives at home with husband. EMS reports that when they got pt up to stretcher, she "perked right up." Pt alert at this time.

## 2015-09-13 IMAGING — CT CT HEAD W/O CM
2 series · 16 of 30 positions shown, 20 images · non-contrast
Comparison: MRI brain 06/17/2009.

CLINICAL DATA: Altered mental status.

EXAM:
CT HEAD WITHOUT CONTRAST
TECHNIQUE: Contiguous axial images were obtained from the base of the skull
through the vertex without intravenous contrast.

[Series 201: head w/o, idose (1) · axial · non-contrast · 0.40mm/px · z∈[+51,+171]mm · 13 of 30 slices shown, 17 images]
[im 3/30  brain]
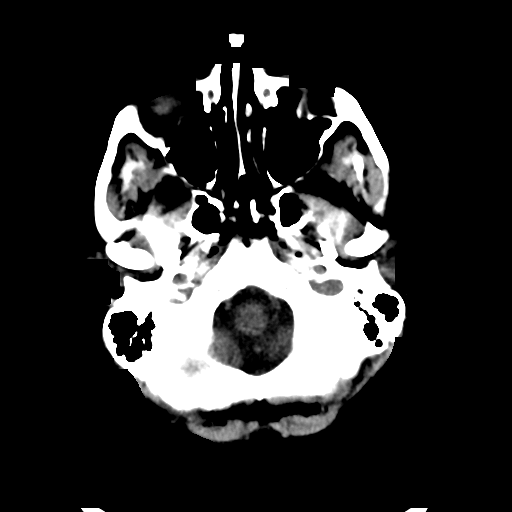
[im 3/30  bone]
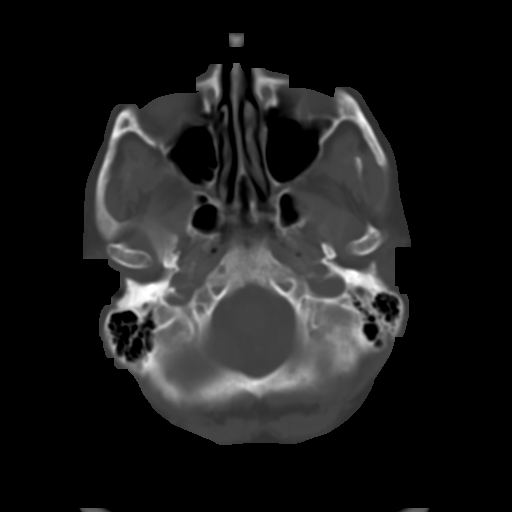
[im 5/30  brain]
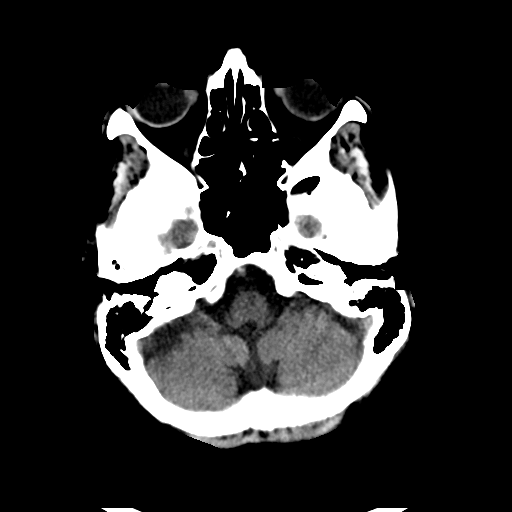
[im 7/30  brain]
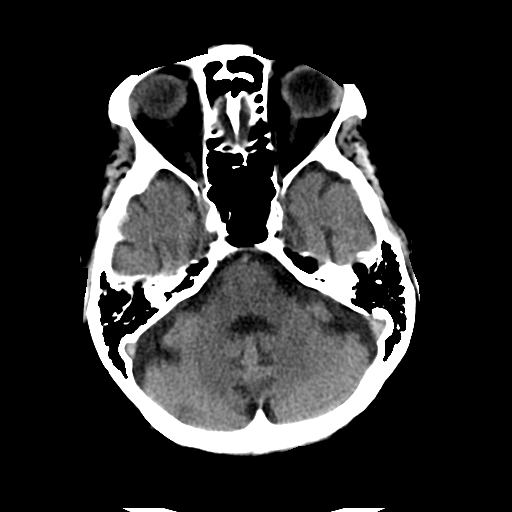
[im 9/30  brain]
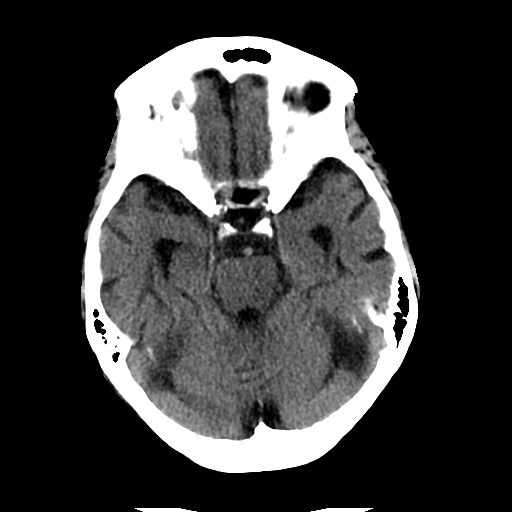
[im 11/30  brain]
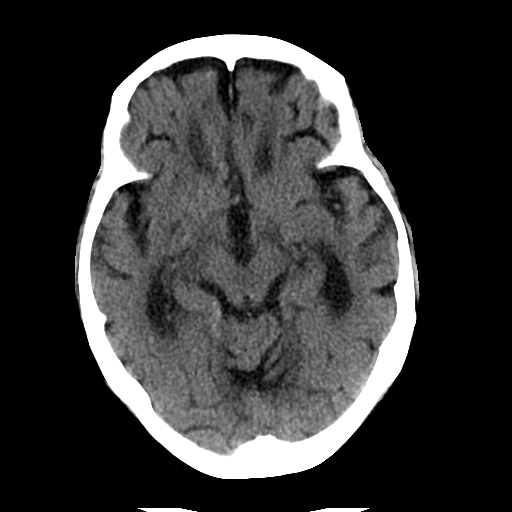
[im 11/30  bone]
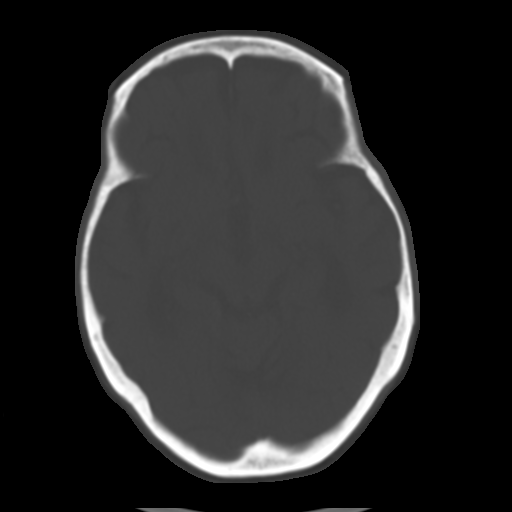
[im 13/30  brain]
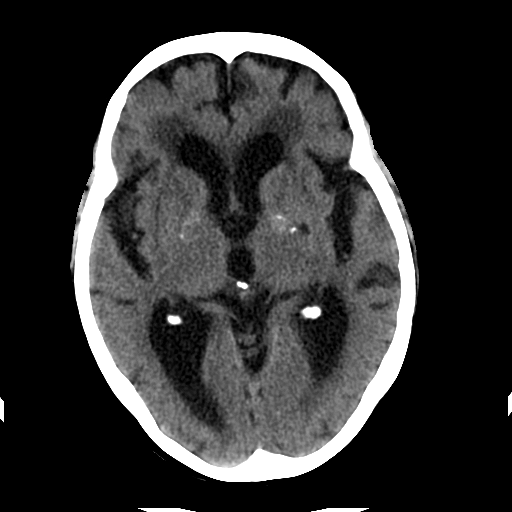
[im 15/30  brain]
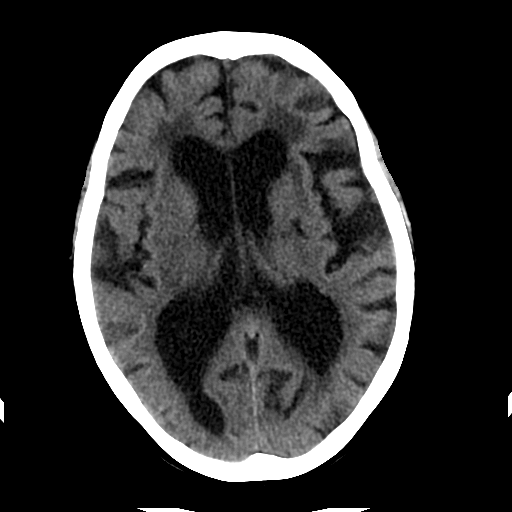
[im 17/30  brain]
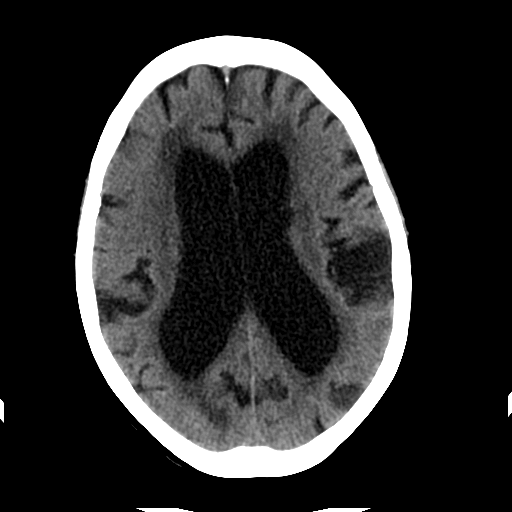
[im 19/30  brain]
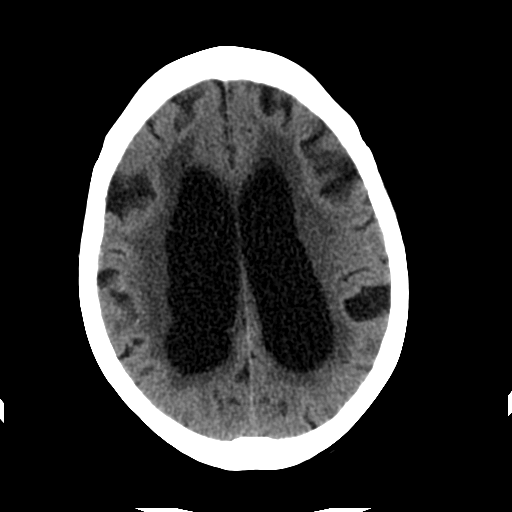
[im 19/30  bone]
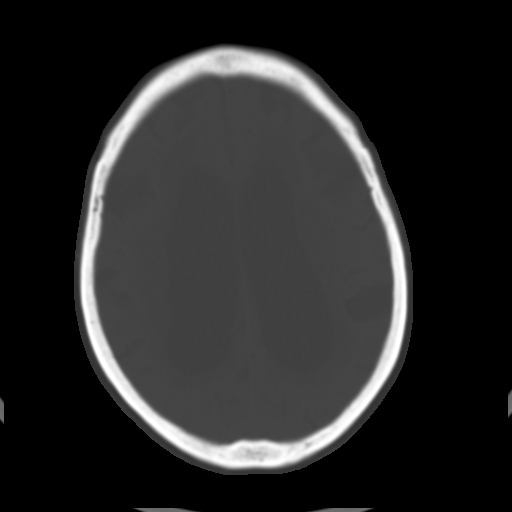
[im 21/30  brain]
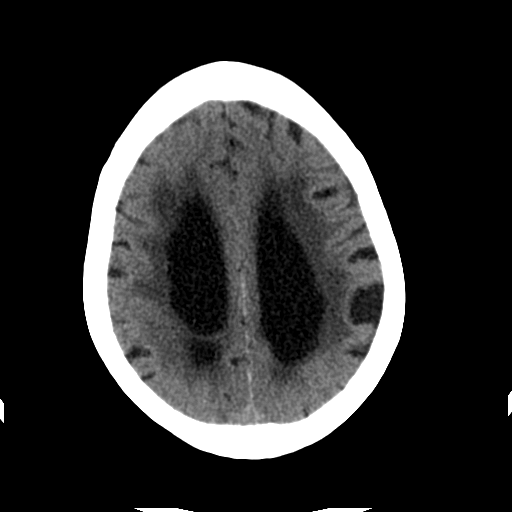
[im 23/30  brain]
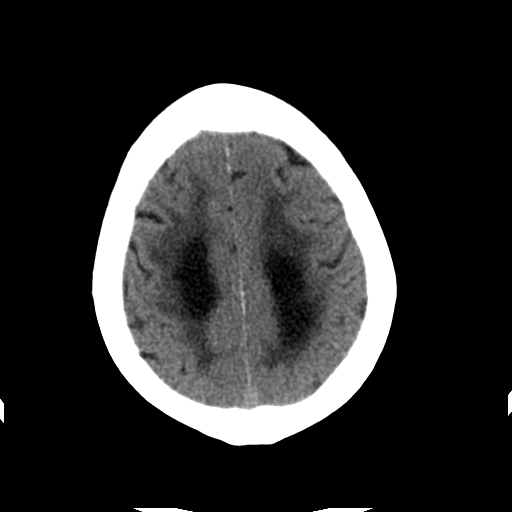
[im 25/30  brain]
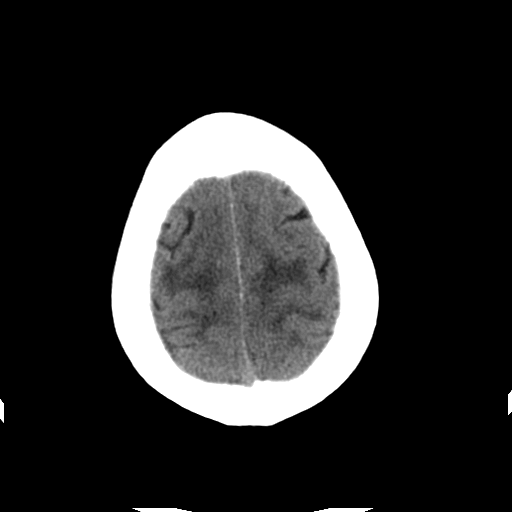
[im 27/30  brain]
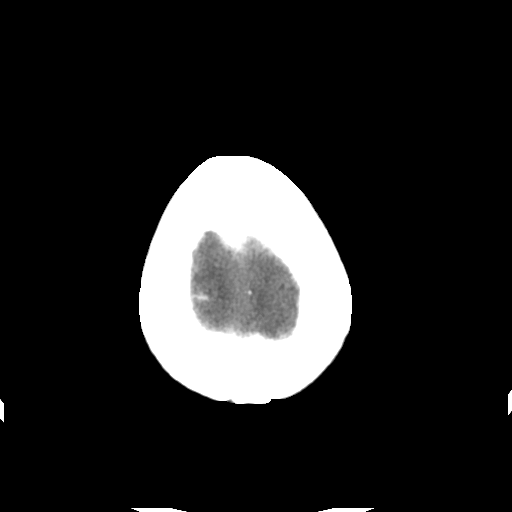
[im 27/30  bone]
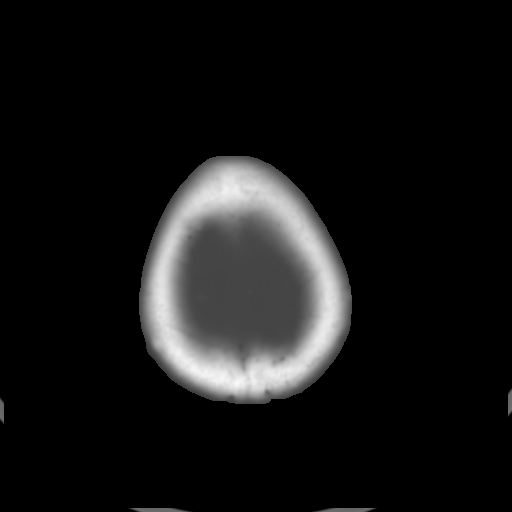

[Series 202: head w/o bone, idose (1) · axial · non-contrast · 0.40mm/px · z∈[+51,+91]mm · 3 of 30 slices shown]
[im 3/30  bone]
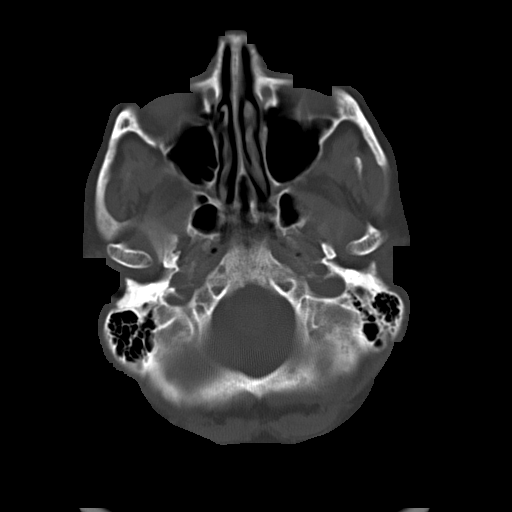
[im 7/30  bone]
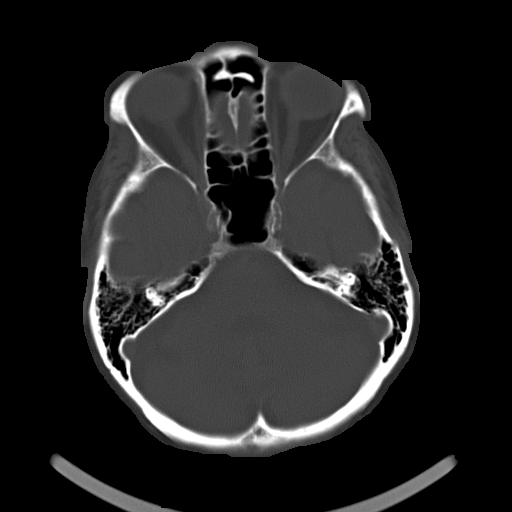
[im 11/30  bone]
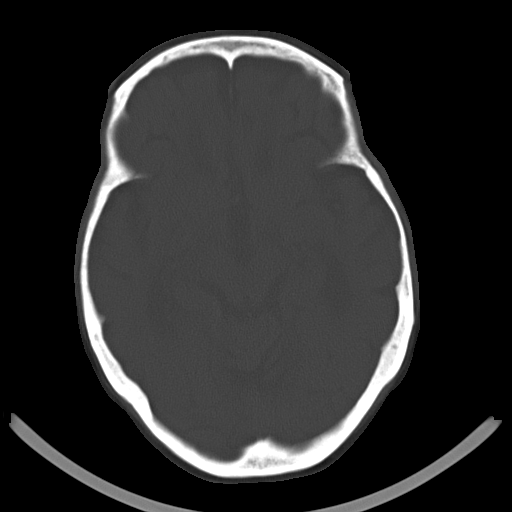

[16 of 30 positions shown; findings below may reference images not displayed]

FINDINGS: No mass. No hydrocephalus. No hemorrhage. White matter changes noted
consistent with chronic ischemia. Old lacunar infarcts. No acute
bony abnormality.
IMPRESSION: Diffuse cerebral and cerebellar atrophy. Chronic white matter
ischemic change and old lacunar infarcts. Similar findings noted on
prior MRI brain 06/17/2009.

## 2015-09-13 IMAGING — CT CT CHEST W/O CM
1 of 5 series · 3 of 36 positions shown, 4 images · non-contrast
Comparison: No priors.

CLINICAL DATA: Fatigue. Possible pulmonary nodule noted on recent
chest radiograph.

EXAM:
CT CHEST WITHOUT CONTRAST
TECHNIQUE: Multidetector CT imaging of the chest was performed following the
standard protocol without IV contrast..

[Series 206: cor 1 · coronal · 0.50mm/px · 3 of 105 slices shown, 4 images]
[im 1/105  mediastinal]
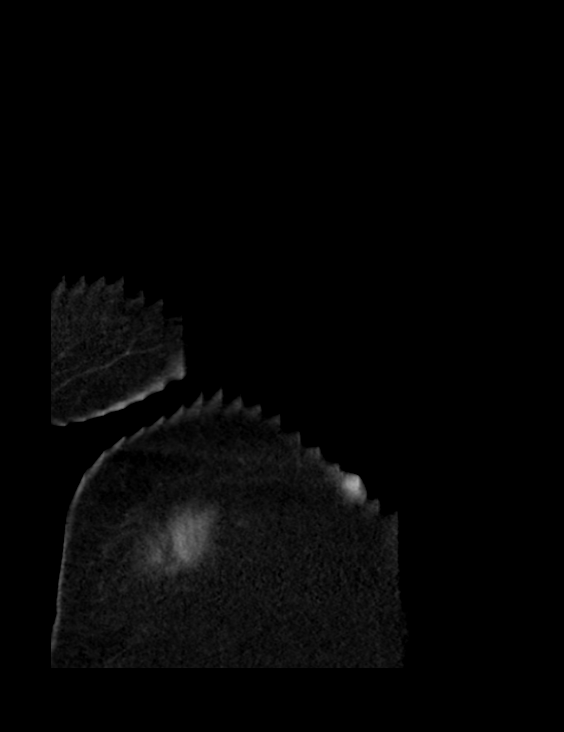
[im 1/105  lung]
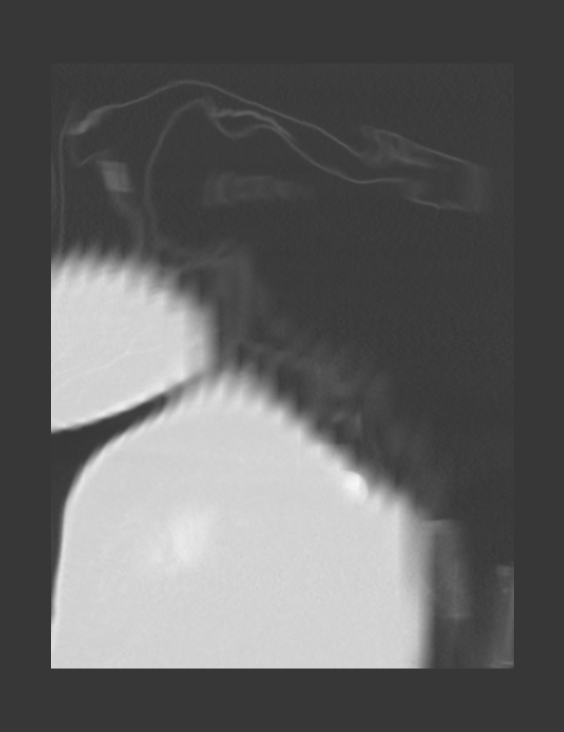
[im 53/105  lung]
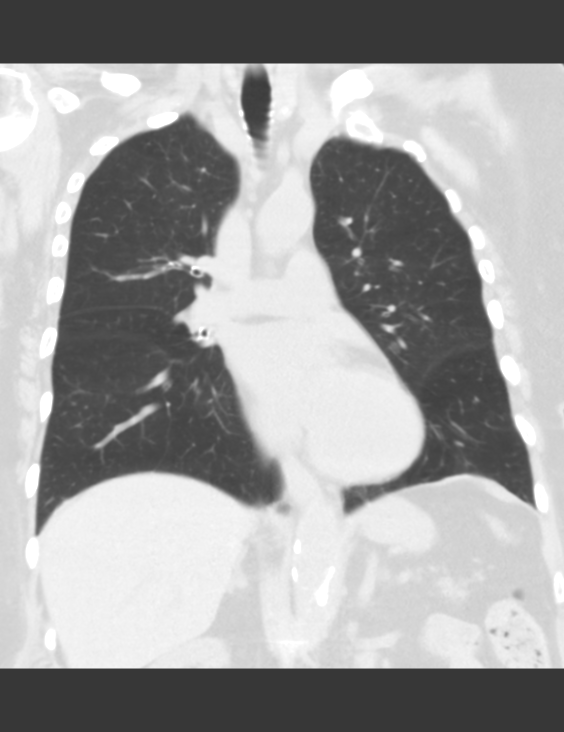
[im 105/105  lung]
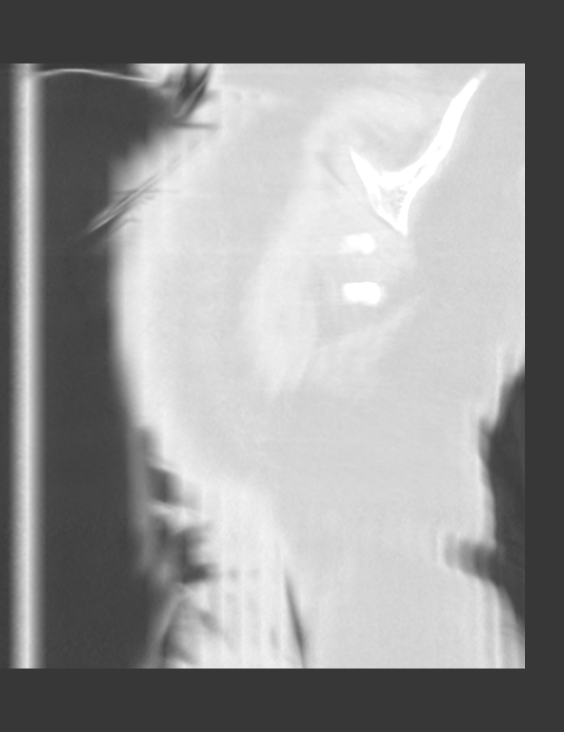

[3 of 36 positions shown; findings below may reference images not displayed]

FINDINGS: Mediastinum: Heart size is normal. There is no significant
pericardial fluid, thickening or pericardial calcification. There is
atherosclerosis of the thoracic aorta, the great vessels of the
mediastinum and the coronary arteries, including calcified
atherosclerotic plaque in the left anterior descending coronary
arteries. Calcifications of the mitral annulus. No pathologically
enlarged mediastinal or hilar lymph nodes. Please note that accurate
exclusion of hilar adenopathy is limited on noncontrast CT scans.
Esophagus is unremarkable in appearance. Prominent epicardial fat
pad adjacent to the right atrium likely accounts for the perceived
nodular opacity on the recent chest radiograph.

Lungs/Pleura: 6 mm right lower lobe nodule (image 34 of series 202).
Other smaller nodules are seen in the lungs bilaterally, including a
5 mm subpleural nodule in the right lower lobe (image 45 of series
202, and a 2 mm nodule in in the left lower lobe (image 24 of series
202). No larger more suspicious appearing pulmonary nodules or
masses are noted. No acute consolidative airspace disease. No
pleural effusions.

Upper Abdomen: 2 cm low-attenuation lesion in the posterior aspect
of the upper pole of the left kidney is incompletely characterized,
but statistically likely a cyst. Nonobstructive calculi in the left
renal collecting system, largest of which measures 11 mm in the
interpolar region. Atherosclerosis.

Musculoskeletal: There are no aggressive appearing lytic or blastic
lesions noted in the visualized portions of the skeleton.
IMPRESSION: 1. The large nodular opacity noted on the recent chest radiograph
corresponds to a prominent epicardial fat pad. This is a benign
finding.
2. However, there are multiple small pulmonary nodules scattered
throughout the lungs bilaterally which are nonspecific, largest of
which measures 6 mm in the right lower lobe. If the patient is at
high risk for bronchogenic carcinoma, follow-up chest CT at 6-12
months is recommended. If the patient is at low risk for
bronchogenic carcinoma, follow-up chest CT at 12 months is
recommended. This recommendation follows the consensus statement:
Guidelines for Management of Small Pulmonary Nodules Detected on CT
Scans: A Statement from the [HOSPITAL] as published in
3. 11 mm nonobstructive calculus in the interpolar collecting system
of the left kidney.
4. Atherosclerosis, including left anterior descending coronary
artery disease.

## 2015-11-07 DEATH — deceased
